# Patient Record
Sex: Female | Born: 1970 | Hispanic: No | Marital: Married | Smoking: Never smoker
Health system: Southern US, Community
[De-identification: ages and names within clinical notes are randomized; demographics above are authoritative.]

## PROBLEM LIST (undated history)

## (undated) DIAGNOSIS — Q283 Other malformations of cerebral vessels: Secondary | ICD-10-CM

## (undated) HISTORY — PX: SHOULDER SURGERY: SHX246

## (undated) HISTORY — PX: BRAIN SURGERY: SHX531

## (undated) HISTORY — PX: EYE MUSCLE SURGERY: SHX370

## (undated) HISTORY — PX: THORACIC OUTLET SURGERY: SHX2502

## (undated) HISTORY — PX: COLONOSCOPY: SHX174

---

## 2004-06-07 ENCOUNTER — Encounter: Admission: RE | Admit: 2004-06-07 | Discharge: 2004-09-05 | Payer: Self-pay

## 2009-10-27 ENCOUNTER — Ambulatory Visit: Payer: Self-pay | Admitting: Diagnostic Radiology

## 2009-10-27 ENCOUNTER — Emergency Department (HOSPITAL_BASED_OUTPATIENT_CLINIC_OR_DEPARTMENT_OTHER): Admission: EM | Admit: 2009-10-27 | Discharge: 2009-10-27 | Payer: Self-pay | Admitting: Emergency Medicine

## 2011-08-23 DIAGNOSIS — I619 Nontraumatic intracerebral hemorrhage, unspecified: Secondary | ICD-10-CM | POA: Insufficient documentation

## 2012-12-07 DIAGNOSIS — F4329 Adjustment disorder with other symptoms: Secondary | ICD-10-CM | POA: Insufficient documentation

## 2012-12-07 DIAGNOSIS — R110 Nausea: Secondary | ICD-10-CM | POA: Insufficient documentation

## 2012-12-07 DIAGNOSIS — N390 Urinary tract infection, site not specified: Secondary | ICD-10-CM | POA: Insufficient documentation

## 2013-05-23 DIAGNOSIS — T8859XA Other complications of anesthesia, initial encounter: Secondary | ICD-10-CM | POA: Insufficient documentation

## 2013-05-23 DIAGNOSIS — Z862 Personal history of diseases of the blood and blood-forming organs and certain disorders involving the immune mechanism: Secondary | ICD-10-CM | POA: Insufficient documentation

## 2013-09-26 DIAGNOSIS — G8110 Spastic hemiplegia affecting unspecified side: Secondary | ICD-10-CM | POA: Insufficient documentation

## 2013-09-26 DIAGNOSIS — R519 Headache, unspecified: Secondary | ICD-10-CM | POA: Insufficient documentation

## 2013-09-26 DIAGNOSIS — R569 Unspecified convulsions: Secondary | ICD-10-CM | POA: Insufficient documentation

## 2013-09-26 DIAGNOSIS — G43919 Migraine, unspecified, intractable, without status migrainosus: Secondary | ICD-10-CM | POA: Insufficient documentation

## 2013-09-26 DIAGNOSIS — F411 Generalized anxiety disorder: Secondary | ICD-10-CM | POA: Insufficient documentation

## 2014-10-02 DIAGNOSIS — H52201 Unspecified astigmatism, right eye: Secondary | ICD-10-CM | POA: Insufficient documentation

## 2014-10-02 DIAGNOSIS — H4921 Sixth [abducent] nerve palsy, right eye: Secondary | ICD-10-CM | POA: Insufficient documentation

## 2014-11-03 DIAGNOSIS — H5021 Vertical strabismus, right eye: Secondary | ICD-10-CM | POA: Insufficient documentation

## 2015-07-04 DIAGNOSIS — R569 Unspecified convulsions: Secondary | ICD-10-CM

## 2015-07-04 HISTORY — DX: Unspecified convulsions: R56.9

## 2015-07-22 DIAGNOSIS — G8190 Hemiplegia, unspecified affecting unspecified side: Secondary | ICD-10-CM | POA: Insufficient documentation

## 2015-07-22 DIAGNOSIS — N921 Excessive and frequent menstruation with irregular cycle: Secondary | ICD-10-CM | POA: Insufficient documentation

## 2015-07-22 DIAGNOSIS — J309 Allergic rhinitis, unspecified: Secondary | ICD-10-CM | POA: Insufficient documentation

## 2015-07-22 DIAGNOSIS — R928 Other abnormal and inconclusive findings on diagnostic imaging of breast: Secondary | ICD-10-CM | POA: Insufficient documentation

## 2015-07-22 DIAGNOSIS — N838 Other noninflammatory disorders of ovary, fallopian tube and broad ligament: Secondary | ICD-10-CM | POA: Insufficient documentation

## 2017-04-25 DIAGNOSIS — E611 Iron deficiency: Secondary | ICD-10-CM | POA: Insufficient documentation

## 2017-05-10 ENCOUNTER — Other Ambulatory Visit: Payer: Self-pay | Admitting: *Deleted

## 2017-05-10 DIAGNOSIS — N6310 Unspecified lump in the right breast, unspecified quadrant: Secondary | ICD-10-CM

## 2017-05-11 ENCOUNTER — Ambulatory Visit
Admission: RE | Admit: 2017-05-11 | Discharge: 2017-05-11 | Disposition: A | Discharge status: Home / Self Care | Payer: 59 | Source: Ambulatory Visit | Attending: *Deleted | Admitting: *Deleted

## 2017-05-11 DIAGNOSIS — N6310 Unspecified lump in the right breast, unspecified quadrant: Secondary | ICD-10-CM

## 2018-03-08 DIAGNOSIS — I872 Venous insufficiency (chronic) (peripheral): Secondary | ICD-10-CM | POA: Insufficient documentation

## 2018-05-22 DIAGNOSIS — Z9889 Other specified postprocedural states: Secondary | ICD-10-CM | POA: Insufficient documentation

## 2018-06-26 IMAGING — MG MM CLIP PLACEMENT
6 series · 6 of 14 positions shown · non-contrast
Comparison: Previous exam(s).

CLINICAL DATA: Status post stereotactic core biopsy right breast
asymmetry

EXAM:
DIAGNOSTIC RIGHT MAMMOGRAM POST STEREOTACTIC BIOPSY

[R CC synth-2D]
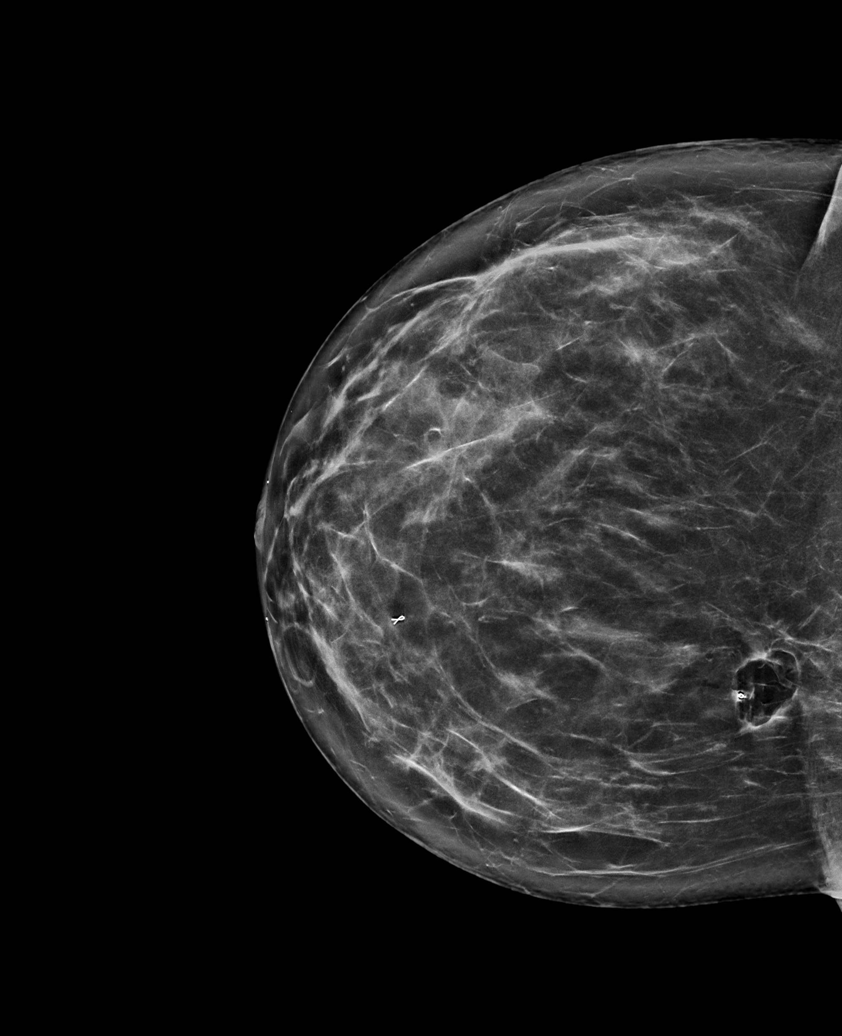

[R MLO]
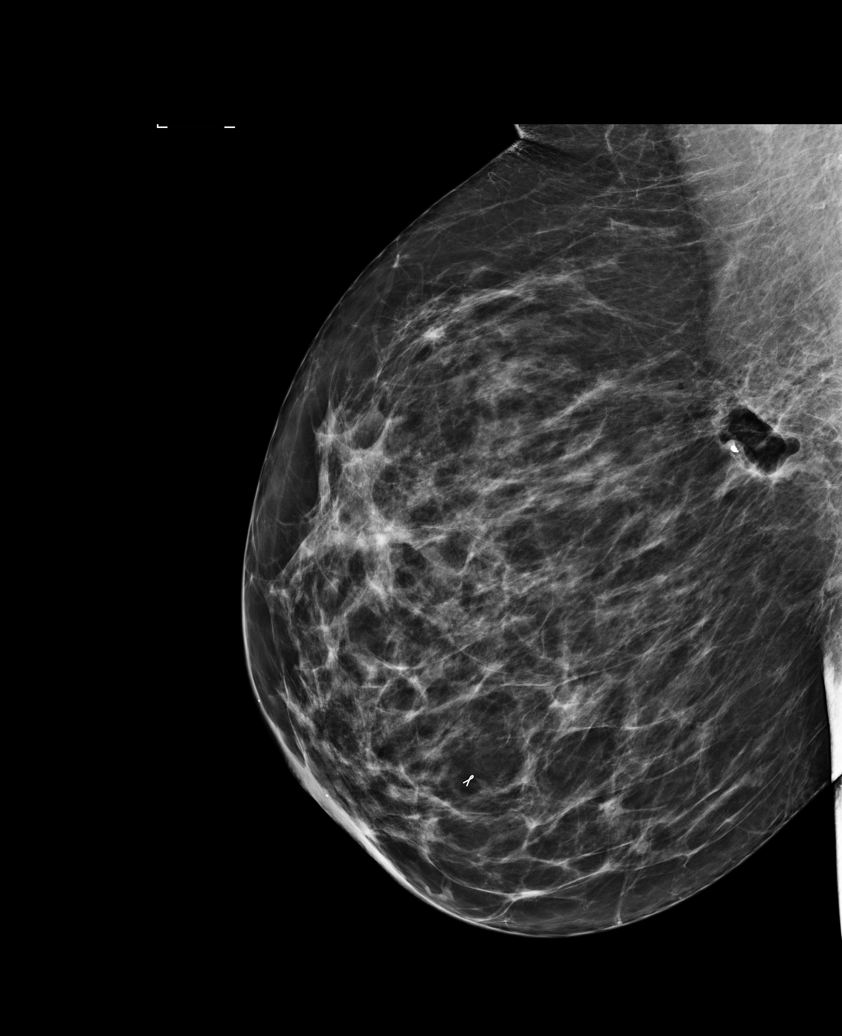

[R CC]
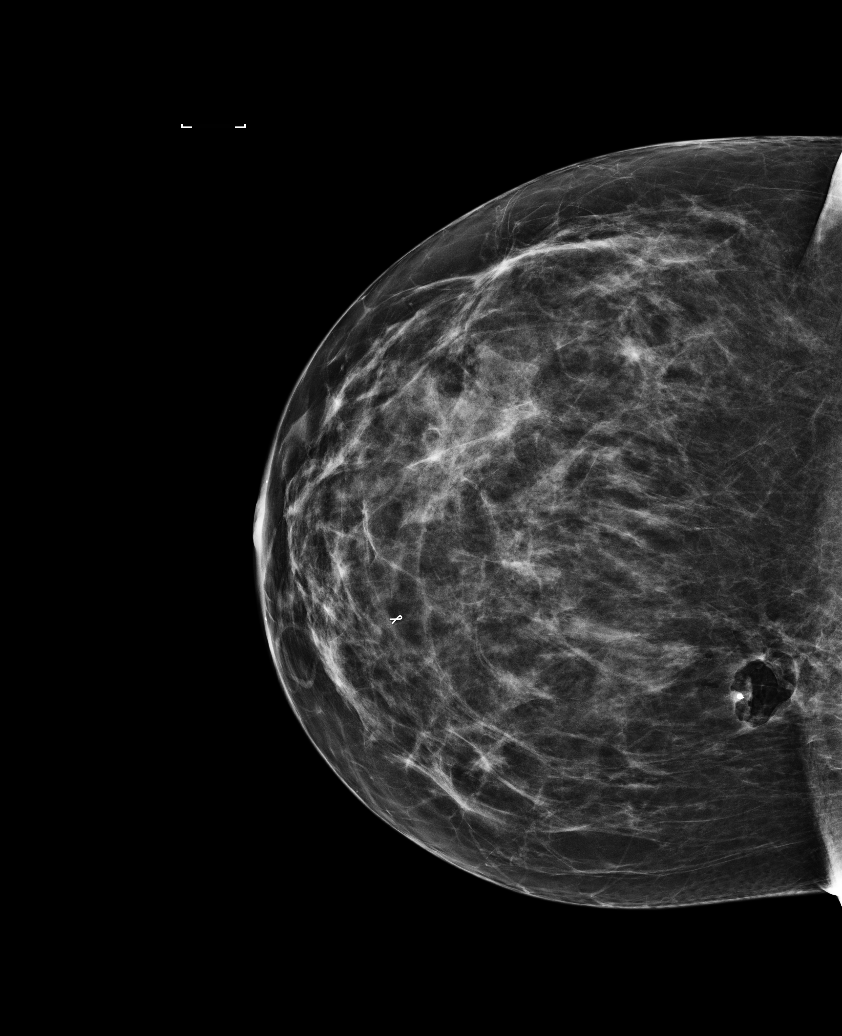

[R MLO synth-2D]
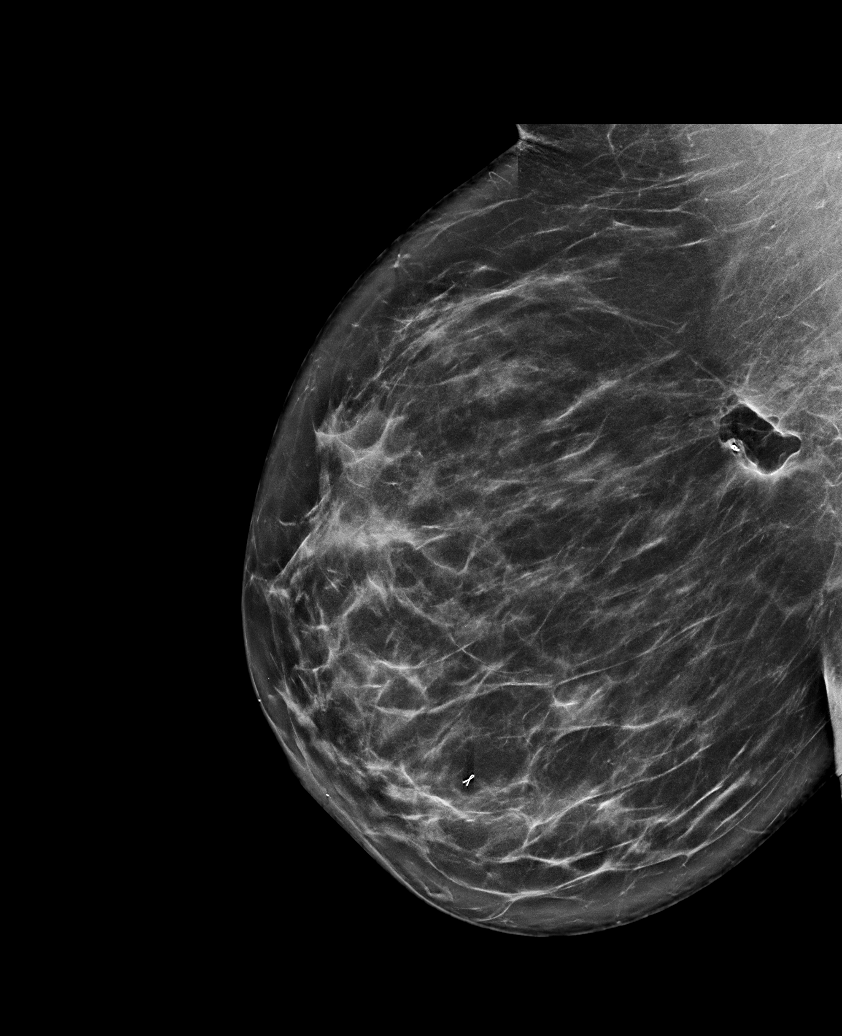

[R MLO tomo · tomo slice 43/86.0]
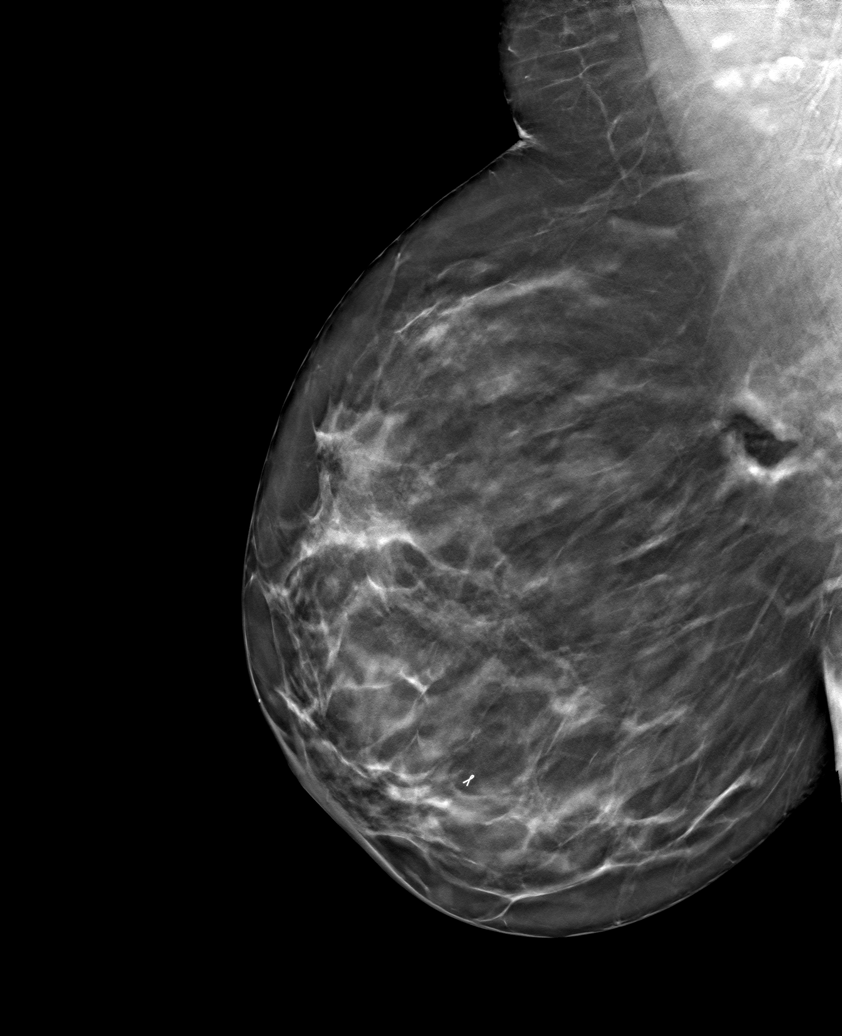

[R CC tomo · tomo slice 40/79.0]
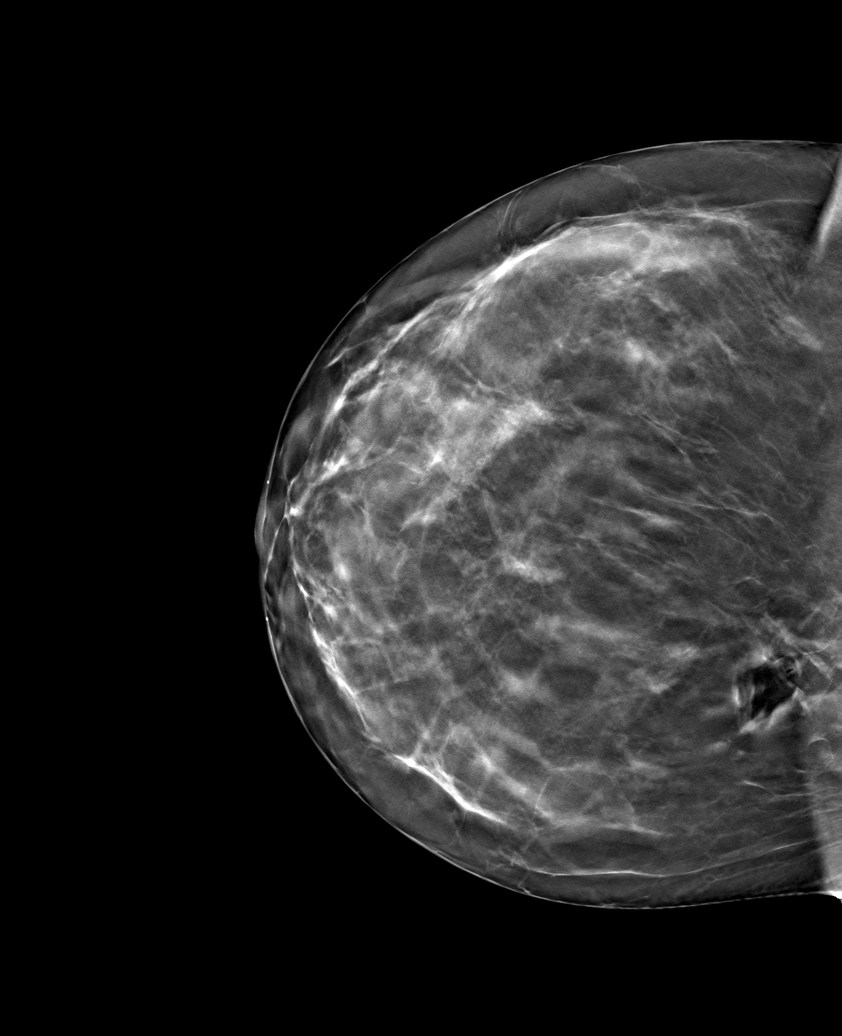

[6 of 14 positions shown; findings below may reference images not displayed]

FINDINGS: Mammographic images were obtained following right breast 3D
tomographic stereotactic guided biopsy of asymmetry in the right
breast. Cc and MLO views of the right breast demonstrate core biopsy
clip in the area of concern.
IMPRESSION: Post biopsy mammogram demonstrating biopsy clip in the area of
concern.

Final Assessment: Post Procedure Mammograms for Marker Placement

## 2019-08-27 DIAGNOSIS — M87039 Idiopathic aseptic necrosis of unspecified carpus: Secondary | ICD-10-CM | POA: Insufficient documentation

## 2019-10-06 DIAGNOSIS — R590 Localized enlarged lymph nodes: Secondary | ICD-10-CM | POA: Insufficient documentation

## 2020-02-05 DIAGNOSIS — M7918 Myalgia, other site: Secondary | ICD-10-CM | POA: Insufficient documentation

## 2020-02-05 DIAGNOSIS — M898X1 Other specified disorders of bone, shoulder: Secondary | ICD-10-CM | POA: Insufficient documentation

## 2020-02-10 DIAGNOSIS — M8560 Other cyst of bone, unspecified site: Secondary | ICD-10-CM | POA: Insufficient documentation

## 2020-02-10 DIAGNOSIS — M25531 Pain in right wrist: Secondary | ICD-10-CM | POA: Insufficient documentation

## 2020-03-23 DIAGNOSIS — Z1371 Encounter for nonprocreative screening for genetic disease carrier status: Secondary | ICD-10-CM | POA: Insufficient documentation

## 2020-05-25 DIAGNOSIS — Z6830 Body mass index (BMI) 30.0-30.9, adult: Secondary | ICD-10-CM | POA: Insufficient documentation

## 2020-05-25 DIAGNOSIS — F430 Acute stress reaction: Secondary | ICD-10-CM | POA: Insufficient documentation

## 2020-06-16 DIAGNOSIS — N951 Menopausal and female climacteric states: Secondary | ICD-10-CM | POA: Insufficient documentation

## 2020-11-16 DIAGNOSIS — G43709 Chronic migraine without aura, not intractable, without status migrainosus: Secondary | ICD-10-CM | POA: Insufficient documentation

## 2021-04-14 DIAGNOSIS — M92212 Osteochondrosis (juvenile) of carpal lunate [Kienbock], left hand: Secondary | ICD-10-CM | POA: Insufficient documentation

## 2021-07-13 DIAGNOSIS — N8003 Adenomyosis of the uterus: Secondary | ICD-10-CM | POA: Insufficient documentation

## 2021-12-08 DIAGNOSIS — M5412 Radiculopathy, cervical region: Secondary | ICD-10-CM | POA: Insufficient documentation

## 2022-04-10 DIAGNOSIS — G540 Brachial plexus disorders: Secondary | ICD-10-CM | POA: Insufficient documentation

## 2022-05-09 ENCOUNTER — Encounter (HOSPITAL_BASED_OUTPATIENT_CLINIC_OR_DEPARTMENT_OTHER): Payer: Self-pay | Admitting: Emergency Medicine

## 2022-05-09 ENCOUNTER — Emergency Department (HOSPITAL_BASED_OUTPATIENT_CLINIC_OR_DEPARTMENT_OTHER): Payer: Medicare Other

## 2022-05-09 ENCOUNTER — Emergency Department (HOSPITAL_BASED_OUTPATIENT_CLINIC_OR_DEPARTMENT_OTHER)
Admission: EM | Admit: 2022-05-09 | Discharge: 2022-05-10 | Disposition: A | Discharge status: Home / Self Care | Payer: Medicare Other | Attending: Emergency Medicine | Admitting: Emergency Medicine

## 2022-05-09 DIAGNOSIS — D72829 Elevated white blood cell count, unspecified: Secondary | ICD-10-CM | POA: Insufficient documentation

## 2022-05-09 DIAGNOSIS — R1030 Lower abdominal pain, unspecified: Secondary | ICD-10-CM | POA: Diagnosis present

## 2022-05-09 DIAGNOSIS — K5903 Drug induced constipation: Secondary | ICD-10-CM | POA: Diagnosis not present

## 2022-05-09 LAB — CBC WITH DIFFERENTIAL/PLATELET
Abs Immature Granulocytes: 0.02 10*3/uL (ref 0.00–0.07)
Basophils Absolute: 0 10*3/uL (ref 0.0–0.1)
Basophils Relative: 0 %
Eosinophils Absolute: 0 10*3/uL (ref 0.0–0.5)
Eosinophils Relative: 0 %
HCT: 45.2 % (ref 36.0–46.0)
Hemoglobin: 14.9 g/dL (ref 12.0–15.0)
Immature Granulocytes: 0 %
Lymphocytes Relative: 7 %
Lymphs Abs: 0.9 10*3/uL (ref 0.7–4.0)
MCH: 28.7 pg (ref 26.0–34.0)
MCHC: 33 g/dL (ref 30.0–36.0)
MCV: 86.9 fL (ref 80.0–100.0)
Monocytes Absolute: 0.4 10*3/uL (ref 0.1–1.0)
Monocytes Relative: 4 %
Neutro Abs: 10.5 10*3/uL — ABNORMAL HIGH (ref 1.7–7.7)
Neutrophils Relative %: 89 %
Platelets: 287 10*3/uL (ref 150–400)
RBC: 5.2 MIL/uL — ABNORMAL HIGH (ref 3.87–5.11)
RDW: 13.8 % (ref 11.5–15.5)
WBC: 11.9 10*3/uL — ABNORMAL HIGH (ref 4.0–10.5)
nRBC: 0 % (ref 0.0–0.2)

## 2022-05-09 LAB — COMPREHENSIVE METABOLIC PANEL
ALT: 96 U/L — ABNORMAL HIGH (ref 0–44)
AST: 49 U/L — ABNORMAL HIGH (ref 15–41)
Albumin: 4 g/dL (ref 3.5–5.0)
Alkaline Phosphatase: 65 U/L (ref 38–126)
Anion gap: 8 (ref 5–15)
BUN: 12 mg/dL (ref 6–20)
CO2: 27 mmol/L (ref 22–32)
Calcium: 9.1 mg/dL (ref 8.9–10.3)
Chloride: 103 mmol/L (ref 98–111)
Creatinine, Ser: 0.81 mg/dL (ref 0.44–1.00)
GFR, Estimated: >60 mL/min (ref >60)
Glucose, Bld: 123 mg/dL — ABNORMAL HIGH (ref 70–99)
Potassium: 4.2 mmol/L (ref 3.5–5.1)
Sodium: 138 mmol/L (ref 135–145)
Total Bilirubin: 0.2 mg/dL — ABNORMAL LOW (ref 0.3–1.2)
Total Protein: 7.7 g/dL (ref 6.5–8.1)

## 2022-05-09 LAB — URINALYSIS, ROUTINE W REFLEX MICROSCOPIC
Bilirubin Urine: NEGATIVE
Glucose, UA: NEGATIVE mg/dL
Hgb urine dipstick: NEGATIVE
Ketones, ur: NEGATIVE mg/dL
Leukocytes,Ua: NEGATIVE
Nitrite: NEGATIVE
Protein, ur: 30 mg/dL — AB
Specific Gravity, Urine: 1.02 (ref 1.005–1.030)
pH: 6.5 (ref 5.0–8.0)

## 2022-05-09 LAB — URINALYSIS, MICROSCOPIC (REFLEX)

## 2022-05-09 LAB — LIPASE, BLOOD: Lipase: 30 U/L (ref 11–51)

## 2022-05-09 MED ORDER — IOHEXOL 300 MG/ML  SOLN
100.0000 mL | Freq: Once | INTRAMUSCULAR | Status: AC | PRN
  Administered 2022-05-09: 100 mL via INTRAVENOUS

## 2022-05-09 MED ORDER — OXYCODONE HCL 5 MG PO TABS
5.0000 mg | ORAL_TABLET | Freq: Once | ORAL | Status: AC
  Administered 2022-05-09: 5 mg via ORAL
  Filled 2022-05-09: qty 1

## 2022-05-09 MED ORDER — POLYETHYLENE GLYCOL 3350 17 G PO PACK
17.0000 g | PACK | Freq: Every day | ORAL | Status: DC
  Administered 2022-05-09: 17 g via ORAL
  Filled 2022-05-09: qty 1

## 2022-05-09 NOTE — Discharge Instructions (Addendum)
  It was a pleasure taking care of you today!   Your abdominal x-ray and CT scan was notable for moderate amount of stool.  It is important to maintain hydration and ensure to maintain fluid intake.  Ensure to eat fiber rich foods.  Take 8 scoops of miralax in 32oz of whatever you would like to drink.(Gatorade comes in this size) You can also use a fleets enema which you can buy over the counter at the pharmacy.  You may follow-up with your primary care provider as needed.  Return to the emergency department if you are experiencing increasing/worsening constipation, abdominal pain, vomiting, unable to maintain fluid intake, fever, or worsening symptoms.

## 2022-05-09 NOTE — ED Provider Notes (Signed)
[facility] EMERGENCY DEPARTMENT Provider Note   CSN: [id number] Arrival date & time: 05/09/22  1552     History  Chief Complaint  Patient presents with   Constipation    Krystal Schultz is a 51 y.o. female who presents emergency department with concerns for constipation onset 11/1.  Patient notes that she has shoulder surgery on 11/2.  Has been taking 5 mg oxycodone since the surgery.  Has not been taking any stool softeners or increasing her water and fiber intake until today when she took a stool softener, fleet enema, and digital manipulation.  Notes that she has increased pain to the area.  Denies past medical history of hemorrhoids.  Has associated lower abdominal pain.  Denies nausea, vomiting.  The history is provided by the patient and the spouse. No language interpreter was used.       Home Medications Prior to Admission medications   Not on File      Allergies    Flurbiprofen and Aspirin    Review of Systems   Review of Systems  Gastrointestinal:  Positive for constipation.  All other systems reviewed and are negative.   Physical Exam Updated Vital Signs BP 119/75 (BP Location: Right Arm)   Pulse 87   Temp 98.5 F (36.9 C) (Oral)   Resp 16   Ht 5\' 7"  (1.702 m)   Wt 79.4 kg   SpO2 100%   BMI 27.41 kg/m  Physical Exam Vitals and nursing note reviewed.  Constitutional:      General: She is not in acute distress.    Appearance: She is not diaphoretic.  HENT:     Head: Normocephalic and atraumatic.     Mouth/Throat:     Pharynx: No oropharyngeal exudate.  Eyes:     General: No scleral icterus.    Conjunctiva/sclera: Conjunctivae normal.  Cardiovascular:     Rate and Rhythm: Normal rate and regular rhythm.     Pulses: Normal pulses.     Heart sounds: Normal heart sounds.  Pulmonary:     Effort: Pulmonary effort is normal. No respiratory distress.     Breath sounds: Normal breath sounds. No wheezing.  Abdominal:     General: Bowel sounds  are normal.     Palpations: Abdomen is soft. There is no mass.     Tenderness: There is generalized abdominal tenderness. There is no guarding or rebound.     Comments: Diffuse abdominal tenderness to palpation  Genitourinary:    Comments: Paramedic chaperons present for exam.  No external hemorrhoids noted. Musculoskeletal:        General: Normal range of motion.     Cervical back: Normal range of motion and neck supple.  Skin:    General: Skin is warm and dry.  Neurological:     Mental Status: She is alert.  Psychiatric:        Behavior: Behavior normal.     ED Results / Procedures / Treatments   Labs (all labs ordered are listed, but only abnormal results are displayed) Labs Reviewed  CBC WITH DIFFERENTIAL/PLATELET - Abnormal; Notable for the following components:      Result Value   WBC 11.9 (*)    RBC 5.20 (*)    Neutro Abs 10.5 (*)    All other components within normal limits  COMPREHENSIVE METABOLIC PANEL - Abnormal; Notable for the following components:   Glucose, Bld 123 (*)    AST 49 (*)    ALT 96 (*)  Total Bilirubin 0.2 (*)    All other components within normal limits  URINALYSIS, ROUTINE W REFLEX MICROSCOPIC - Abnormal; Notable for the following components:   Protein, ur 30 (*)    All other components within normal limits  URINALYSIS, MICROSCOPIC (REFLEX) - Abnormal; Notable for the following components:   Bacteria, UA FEW (*)    All other components within normal limits  LIPASE, BLOOD    EKG None  Radiology CT ABDOMEN PELVIS W CONTRAST  Result Date: 05/09/2022 CLINICAL DATA:  Suspected bowel obstruction. EXAM: CT ABDOMEN AND PELVIS WITH CONTRAST TECHNIQUE: Multidetector CT imaging of the abdomen and pelvis was performed using the standard protocol following bolus administration of intravenous contrast. RADIATION DOSE REDUCTION: This exam was performed according to the departmental dose-optimization program which includes automated exposure control,  adjustment of the mA and/or kV according to patient size and/or use of iterative reconstruction technique. CONTRAST:  OMNIPAQUE IOHEXOL 300 MG/ML  SOLN COMPARISON:  April 13, 2018 FINDINGS: Lower chest: Very small bilateral pleural effusions are seen. Hepatobiliary: A stable, 17 mm x 18 mm ill-defined area of parenchymal low attenuation is seen within the posterior aspect of the right lobe of the liver (axial CT image 11, CT series 2). No gallstones, gallbladder wall thickening, or biliary dilatation. Pancreas: Unremarkable. No pancreatic ductal dilatation or surrounding inflammatory changes. Spleen: Normal in size without focal abnormality. Adrenals/Urinary Tract: Adrenal glands are unremarkable. Kidneys are normal, without renal calculi, focal lesion, or hydronephrosis. Urinary bladder is poorly distended and subsequently limited in evaluation. Stomach/Bowel: There is a small hiatal hernia. Appendix appears normal. There is a large stool burden with a very large amount of impacted stool seen within the mid to distal sigmoid colon and rectum. No evidence of bowel wall thickening, distention, or inflammatory changes. Vascular/Lymphatic: No significant vascular findings are present. No enlarged abdominal or pelvic lymph nodes. Reproductive: Uterus and bilateral adnexa are unremarkable. Other: No abdominal wall hernia or abnormality. No abdominopelvic ascites. Musculoskeletal: No acute or significant osseous findings. IMPRESSION: 1. Large stool burden with a very large amount of impacted stool within the mid to distal sigmoid colon and rectum. 2. Very small bilateral pleural effusions. 3. Findings likely consistent with a stable hepatic hemangioma. Correlation with nonemergent hepatic ultrasound is recommended. 4. Small hiatal hernia. Electronically Signed   By: Aram Candela M.D.   On: 05/09/2022 19:29   DG Abdomen 1 View  Result Date: 05/09/2022 CLINICAL DATA:  Possible constipation. EXAM: ABDOMEN -  1 VIEW COMPARISON:  None Available. FINDINGS: Single frontal view of the abdomen obtained. The upper abdomen is not included in the field of view. Moderate stool in the ascending colon. Moderate stool in the rectosigmoid colon. Rectal distention of 8 cm. No small bowel dilatation or evidence of obstruction. Calcifications in the pelvis are typical of phleboliths. No abdominal radiopaque calculi. No acute osseous abnormalities are seen. IMPRESSION: Moderate stool in the ascending and rectosigmoid colon with rectal distention of 8 cm. No bowel obstruction. Electronically Signed   By: Narda Rutherford M.D.   On: 05/09/2022 16:35    Procedures Fecal disimpaction  Date/Time: 05/10/2022 12:21 AM  Performed by: Chestine Spore A, PA-C Authorized by: Karenann Cai, PA-C  Consent: Verbal consent obtained. Risks and benefits: risks, benefits and alternatives were discussed Consent given by: patient Imaging studies: imaging studies available Patient identity confirmed: verbally with patient and hospital-assigned identification number Local anesthesia used: no  Anesthesia: Local anesthesia used: no  Sedation: Patient sedated: no  Patient tolerance: patient tolerated the procedure well with no immediate complications       Medications Ordered in ED Medications  polyethylene glycol (MIRALAX / GLYCOLAX) packet 17 g (17 g Oral Given 05/09/22 2212)  iohexol (OMNIPAQUE) 300 MG/ML solution 100 mL (100 mLs Intravenous Contrast Given 05/09/22 1859)  oxyCODONE (Oxy IR/ROXICODONE) immediate release tablet 5 mg (5 mg Oral Given 05/09/22 1940)    ED Course/ Medical Decision Making/ A&P                            Medical Decision Making Amount and/or Complexity of Data Reviewed Radiology: ordered.  Risk Prescription drug management.   Pt presents with constipation onset 11/1.  Notes that she had shoulder surgery on 11/2 and was started on oxycodone.  Patient was not instructed to take MiraLAX or  increase her fiber intake while taking the narcotic. Vital signs, patient afebrile. On exam, pt with diffuse abdominal tenderness to palpation.  No acute cardiovascular, respiratory, abdominal exam findings. Differential diagnosis includes constipation, appendicitis, acute cystitis, cholecystitis, diverticulitis.   Additional history obtained:  Additional history obtained from Spouse/Significant Other  Labs:  I ordered, and personally interpreted labs.  The pertinent results include:   Lipase unremarkable. CMP with slightly elevated AST, ALT otherwise unremarkable. CBC with elevated WBC at 11.9 Urinalysis unremarkable  Imaging: I ordered imaging studies including abdominal x-ray, CT abdomen pelvis I independently visualized and interpreted imaging which showed:  Moderate stool in the ascending and rectosigmoid colon with rectal  distention of 8 cm. No bowel obstruction.   1. Large stool burden with a very large amount of impacted stool  within the mid to distal sigmoid colon and rectum.  2. Very small bilateral pleural effusions.  3. Findings likely consistent with a stable hepatic hemangioma.  Correlation with nonemergent hepatic ultrasound is recommended.  4. Small hiatal hernia.   I agree with the radiologist interpretation  Medications:  I ordered medication including MiraLAX, oxycodone for symptom management Reevaluation of the patient after these medicines and interventions, I reevaluated the patient and found that they have improved I have reviewed the patients home medicines and have made adjustments as needed   Disposition: Presentation suspicious for constipation in the setting of recent narcotic use.  Doubt bowel obstruction, appendicitis, cholecystitis, pancreatitis, diverticulitis. After consideration of the diagnostic results and the patients response to treatment, I feel that the patient would benefit from Discharge home.  Patient instructed on use of MiraLAX.   Provided work note.  Supportive care measures and strict return precautions discussed with patient at bedside. Pt acknowledges and verbalizes understanding. Pt appears safe for discharge. Follow up as indicated in discharge paperwork.    This chart was dictated using voice recognition software, Dragon. Despite the best efforts of this provider to proofread and correct errors, errors may still occur which can change documentation meaning.  Final Clinical Impression(s) / ED Diagnoses Final diagnoses:  Drug-induced constipation    Rx / DC Orders ED Discharge Orders     None         Sulamita Lafountain A, PA-C 05/10/22 0028    Deno Etienne, DO 05/10/22 3614

## 2022-05-09 NOTE — ED Triage Notes (Signed)
Reports possible constipation since Wednesday (11/1). Has shoulder surgery on 11/2 and is concerned the pain medication she is taking post-op is causing the constipation. Unrelieved with stool softeners, enema, and digital manipulation. Pressure in anus area. Unable to sit.

## 2022-05-09 NOTE — ED Notes (Signed)
Pt companion requested 2 kind bars and a cola for his self. Companion given the same

## 2022-07-14 DIAGNOSIS — M7542 Impingement syndrome of left shoulder: Secondary | ICD-10-CM | POA: Insufficient documentation

## 2022-07-14 DIAGNOSIS — M654 Radial styloid tenosynovitis [de Quervain]: Secondary | ICD-10-CM | POA: Insufficient documentation

## 2022-09-14 NOTE — H&P (Signed)
ubjective:    HPI   Returns for follow up discussion prior to planned breast reduction. Motivated in part by daughter's experience who had reduction surgery with me last year. Current 40DD. Reports several year history neck back and shoulder pain, especially upper back between shoulder blades. 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 Her attempts at pain control over years has included specialty fitted bras, Tylenol, PT, gabapentin for over 3 month trial each without change. Denies rashes.    Avoids ASA/NSAIDs due to cerebral hemorrhage history.   Wt down over 50 lb over last year since starting Ozempic for weight loss. Goal 160-165 lb.   MMG 05/2022 normal. Half sister with history premenopausal breast ca. Patient reports she herself tested for BRCA mutations and this was negative. Father with colon ca.   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 Underwent surgery for removal. 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   Disabled secondary to above. Lives with spouse.   Review of Systems  Constitutional: Positive for fatigue.  HENT: Positive for sinus pressure.   Eyes: Positive for visual disturbance.  Musculoskeletal: Positive for back pain and neck pain.  Neurological: Positive for weakness, numbness and headaches.  Psychiatric/Behavioral: Positive for sleep disturbance.  Remainder 12 point review negative     Objective:  Physical Exam Cardiovascular:     Normal rate. Normal heart sounds Pulmonary: Clear to auscultation Lymphadenopathy:     Upper Body:     Right upper body: No axillary adenopathy.     Left upper body: No axillary adenopathy.  Skin:    Comments: Fitzpatrick 3      +shoulder grooving Breasts: no masses grade 3 ptosis bilateral SN to nipple R 35 L 33 cm BW R  21 L 20 cm Nipple to IMF R 11 L 11 cm     Assessment:    Macromastia Chronic neck back shoulder pain     Plan:    Hold Ozempic week prior to surgery.   Chronic neck and back and shoulder pain > 6 month duration that has failed conservative measures including supportive garments, PT, analgesia. Macromastia is not due to an active endocrine or metabolic process.   Reviewed reduction with anchor type scars, OP surgery, drains, post operative visits and limitations, recovery. Diminished sensation nipple and breast skin, risk of nipple loss, wound healing problems, asymmetry, incidental carcinoma, changes with wt gain/loss, aging, unacceptable cosmetic appearance reviewed.    Additional risks including but not limited to bleeding hematoma seroma need for additional procedures infection damage to adjacent structures blood clots in legs or lungs reviewed. Completed ASPS consent for breast reduction.   Anticipate at least 500 g reduction from each breast.   Drain teaching completed. Rx for oxycodone given.   Ok to proceed with scheduled injections with pain service. Hold PT/OT  post op at least through follow up visit.

## 2022-09-22 ENCOUNTER — Encounter (HOSPITAL_BASED_OUTPATIENT_CLINIC_OR_DEPARTMENT_OTHER): Payer: Self-pay | Admitting: Plastic Surgery

## 2022-09-22 ENCOUNTER — Other Ambulatory Visit: Payer: Self-pay

## 2022-10-02 NOTE — Anesthesia Preprocedure Evaluation (Signed)
Anesthesia Evaluation  Patient identified by MRN, date of birth, ID band Patient awake    Reviewed: Allergy & Precautions, H&P , NPO status , Patient's Chart, lab work & pertinent test results  Airway Mallampati: II  TM Distance: >3 FB Neck ROM: Full    Dental no notable dental hx. (+) Dental Advisory Given, Teeth Intact   Pulmonary neg pulmonary ROS   Pulmonary exam normal breath sounds clear to auscultation       Cardiovascular Exercise Tolerance: Good negative cardio ROS  Rhythm:Regular Rate:Normal     Neuro/Psych Seizures -, Well Controlled,   negative psych ROS   GI/Hepatic negative GI ROS, Neg liver ROS,,,  Endo/Other  negative endocrine ROS    Renal/GU negative Renal ROS  negative genitourinary   Musculoskeletal   Abdominal   Peds  Hematology negative hematology ROS (+)   Anesthesia Other Findings   Reproductive/Obstetrics negative OB ROS                             Anesthesia Physical Anesthesia Plan  ASA: 2  Anesthesia Plan: General   Post-op Pain Management: Tylenol PO (pre-op)*   Induction: Intravenous  PONV Risk Score and Plan: 4 or greater and Ondansetron, Dexamethasone, Midazolam and Scopolamine patch - Pre-op  Airway Management Planned: Oral ETT  Additional Equipment:   Intra-op Plan:   Post-operative Plan: Extubation in OR  Informed Consent: I have reviewed the patients History and Physical, chart, labs and discussed the procedure including the risks, benefits and alternatives for the proposed anesthesia with the patient or authorized representative who has indicated his/her understanding and acceptance.     Dental advisory given  Plan Discussed with: CRNA  Anesthesia Plan Comments:        Anesthesia Quick Evaluation

## 2022-10-03 ENCOUNTER — Encounter (HOSPITAL_BASED_OUTPATIENT_CLINIC_OR_DEPARTMENT_OTHER): Payer: Self-pay | Admitting: Plastic Surgery

## 2022-10-03 ENCOUNTER — Ambulatory Visit (HOSPITAL_BASED_OUTPATIENT_CLINIC_OR_DEPARTMENT_OTHER): Payer: Medicare Other | Admitting: Anesthesiology

## 2022-10-03 ENCOUNTER — Other Ambulatory Visit: Payer: Self-pay

## 2022-10-03 ENCOUNTER — Ambulatory Visit (HOSPITAL_BASED_OUTPATIENT_CLINIC_OR_DEPARTMENT_OTHER)
Admission: RE | Admit: 2022-10-03 | Discharge: 2022-10-03 | Disposition: A | Discharge status: Home / Self Care | Payer: Medicare Other | Attending: Plastic Surgery | Admitting: Plastic Surgery

## 2022-10-03 ENCOUNTER — Encounter (HOSPITAL_BASED_OUTPATIENT_CLINIC_OR_DEPARTMENT_OTHER)
Admission: RE | Disposition: A | Discharge status: Home / Self Care | Payer: Self-pay | Source: Home / Self Care | Attending: Plastic Surgery

## 2022-10-03 DIAGNOSIS — M25519 Pain in unspecified shoulder: Secondary | ICD-10-CM | POA: Diagnosis not present

## 2022-10-03 DIAGNOSIS — I69154 Hemiplegia and hemiparesis following nontraumatic intracerebral hemorrhage affecting left non-dominant side: Secondary | ICD-10-CM | POA: Diagnosis not present

## 2022-10-03 DIAGNOSIS — Z803 Family history of malignant neoplasm of breast: Secondary | ICD-10-CM | POA: Insufficient documentation

## 2022-10-03 DIAGNOSIS — M542 Cervicalgia: Secondary | ICD-10-CM | POA: Diagnosis not present

## 2022-10-03 DIAGNOSIS — Z7985 Long-term (current) use of injectable non-insulin antidiabetic drugs: Secondary | ICD-10-CM | POA: Insufficient documentation

## 2022-10-03 DIAGNOSIS — N62 Hypertrophy of breast: Secondary | ICD-10-CM | POA: Diagnosis present

## 2022-10-03 DIAGNOSIS — I69198 Other sequelae of nontraumatic intracerebral hemorrhage: Secondary | ICD-10-CM | POA: Diagnosis not present

## 2022-10-03 DIAGNOSIS — G8929 Other chronic pain: Secondary | ICD-10-CM | POA: Diagnosis not present

## 2022-10-03 DIAGNOSIS — H4920 Sixth [abducent] nerve palsy, unspecified eye: Secondary | ICD-10-CM | POA: Diagnosis not present

## 2022-10-03 DIAGNOSIS — M549 Dorsalgia, unspecified: Secondary | ICD-10-CM | POA: Diagnosis not present

## 2022-10-03 HISTORY — DX: Other malformations of cerebral vessels: Q28.3

## 2022-10-03 HISTORY — PX: BREAST REDUCTION SURGERY: SHX8

## 2022-10-03 SURGERY — MAMMOPLASTY, REDUCTION
Anesthesia: General | Site: Breast | Laterality: Bilateral

## 2022-10-03 MED ORDER — LIDOCAINE 2% (20 MG/ML) 5 ML SYRINGE
INTRAMUSCULAR | Status: DC | PRN
  Administered 2022-10-03: 60 mg via INTRAVENOUS

## 2022-10-03 MED ORDER — ROCURONIUM BROMIDE 10 MG/ML (PF) SYRINGE
PREFILLED_SYRINGE | INTRAVENOUS | Status: AC
  Filled 2022-10-03: qty 10

## 2022-10-03 MED ORDER — MIDAZOLAM HCL 2 MG/2ML IJ SOLN
INTRAMUSCULAR | Status: DC | PRN
  Administered 2022-10-03: 2 mg via INTRAVENOUS

## 2022-10-03 MED ORDER — CHLORHEXIDINE GLUCONATE CLOTH 2 % EX PADS: 6.0000 | MEDICATED_PAD | Freq: Once | CUTANEOUS | Status: DC

## 2022-10-03 MED ORDER — BUPIVACAINE HCL (PF) 0.5 % IJ SOLN
INTRAMUSCULAR | Status: AC
  Filled 2022-10-03: qty 30

## 2022-10-03 MED ORDER — DEXAMETHASONE SODIUM PHOSPHATE 10 MG/ML IJ SOLN
INTRAMUSCULAR | Status: AC
  Filled 2022-10-03: qty 1

## 2022-10-03 MED ORDER — EPHEDRINE 5 MG/ML INJ
INTRAVENOUS | Status: AC
  Filled 2022-10-03: qty 5

## 2022-10-03 MED ORDER — ACETAMINOPHEN 500 MG PO TABS
1000.0000 mg | ORAL_TABLET | ORAL | Status: AC
  Administered 2022-10-03: 1000 mg via ORAL

## 2022-10-03 MED ORDER — ACETAMINOPHEN 500 MG PO TABS: 1000.0000 mg | ORAL_TABLET | Freq: Once | ORAL | Status: DC

## 2022-10-03 MED ORDER — ONDANSETRON HCL 4 MG/2ML IJ SOLN
INTRAMUSCULAR | Status: AC
  Filled 2022-10-03: qty 2

## 2022-10-03 MED ORDER — LACTATED RINGERS IV SOLN: INTRAVENOUS | Status: DC

## 2022-10-03 MED ORDER — CEFAZOLIN SODIUM-DEXTROSE 2-4 GM/100ML-% IV SOLN
INTRAVENOUS | Status: AC
  Filled 2022-10-03: qty 100

## 2022-10-03 MED ORDER — BUPIVACAINE HCL (PF) 0.5 % IJ SOLN
INTRAMUSCULAR | Status: DC | PRN
  Administered 2022-10-03: 30 mL

## 2022-10-03 MED ORDER — HYDROMORPHONE HCL 1 MG/ML IJ SOLN
INTRAMUSCULAR | Status: AC
  Filled 2022-10-03: qty 0.5

## 2022-10-03 MED ORDER — ACETAMINOPHEN 500 MG PO TABS
ORAL_TABLET | ORAL | Status: AC
  Filled 2022-10-03: qty 2

## 2022-10-03 MED ORDER — SUGAMMADEX SODIUM 200 MG/2ML IV SOLN
INTRAVENOUS | Status: DC | PRN
  Administered 2022-10-03: 200 mg via INTRAVENOUS

## 2022-10-03 MED ORDER — FENTANYL CITRATE (PF) 100 MCG/2ML IJ SOLN
INTRAMUSCULAR | Status: AC
  Filled 2022-10-03: qty 2

## 2022-10-03 MED ORDER — OXYCODONE HCL 5 MG PO TABS
ORAL_TABLET | ORAL | Status: AC
  Filled 2022-10-03: qty 1

## 2022-10-03 MED ORDER — LIDOCAINE 2% (20 MG/ML) 5 ML SYRINGE
INTRAMUSCULAR | Status: AC
  Filled 2022-10-03: qty 5

## 2022-10-03 MED ORDER — EPHEDRINE SULFATE-NACL 50-0.9 MG/10ML-% IV SOSY
PREFILLED_SYRINGE | INTRAVENOUS | Status: DC | PRN
  Administered 2022-10-03: 10 mg via INTRAVENOUS

## 2022-10-03 MED ORDER — ROCURONIUM BROMIDE 10 MG/ML (PF) SYRINGE
PREFILLED_SYRINGE | INTRAVENOUS | Status: DC | PRN
  Administered 2022-10-03: 20 mg via INTRAVENOUS
  Administered 2022-10-03: 50 mg via INTRAVENOUS

## 2022-10-03 MED ORDER — SCOPOLAMINE 1 MG/3DAYS TD PT72
MEDICATED_PATCH | TRANSDERMAL | Status: AC
  Filled 2022-10-03: qty 1

## 2022-10-03 MED ORDER — CEFAZOLIN SODIUM-DEXTROSE 2-4 GM/100ML-% IV SOLN
2.0000 g | INTRAVENOUS | Status: AC
  Administered 2022-10-03: 2 g via INTRAVENOUS

## 2022-10-03 MED ORDER — PROPOFOL 10 MG/ML IV BOLUS
INTRAVENOUS | Status: AC
  Filled 2022-10-03: qty 20

## 2022-10-03 MED ORDER — 0.9 % SODIUM CHLORIDE (POUR BTL) OPTIME
TOPICAL | Status: DC | PRN
  Administered 2022-10-03: 500 mL

## 2022-10-03 MED ORDER — SCOPOLAMINE 1 MG/3DAYS TD PT72
1.0000 | MEDICATED_PATCH | TRANSDERMAL | Status: DC
  Administered 2022-10-03: 1.5 mg via TRANSDERMAL

## 2022-10-03 MED ORDER — OXYCODONE HCL 5 MG PO TABS
5.0000 mg | ORAL_TABLET | ORAL | Status: AC | PRN
  Administered 2022-10-03: 5 mg via ORAL

## 2022-10-03 MED ORDER — ONDANSETRON HCL 4 MG/2ML IJ SOLN
INTRAMUSCULAR | Status: DC | PRN
  Administered 2022-10-03: 4 mg via INTRAVENOUS

## 2022-10-03 MED ORDER — PHENYLEPHRINE 80 MCG/ML (10ML) SYRINGE FOR IV PUSH (FOR BLOOD PRESSURE SUPPORT)
PREFILLED_SYRINGE | INTRAVENOUS | Status: AC
  Filled 2022-10-03: qty 10

## 2022-10-03 MED ORDER — PROPOFOL 10 MG/ML IV BOLUS
INTRAVENOUS | Status: DC | PRN
  Administered 2022-10-03: 130 mg via INTRAVENOUS

## 2022-10-03 MED ORDER — FENTANYL CITRATE (PF) 100 MCG/2ML IJ SOLN
INTRAMUSCULAR | Status: DC | PRN
  Administered 2022-10-03 (×2): 50 ug via INTRAVENOUS
  Administered 2022-10-03: 100 ug via INTRAVENOUS

## 2022-10-03 MED ORDER — PHENYLEPHRINE 80 MCG/ML (10ML) SYRINGE FOR IV PUSH (FOR BLOOD PRESSURE SUPPORT)
PREFILLED_SYRINGE | INTRAVENOUS | Status: DC | PRN
  Administered 2022-10-03 (×5): 160 ug via INTRAVENOUS

## 2022-10-03 MED ORDER — DEXAMETHASONE SODIUM PHOSPHATE 10 MG/ML IJ SOLN
INTRAMUSCULAR | Status: DC | PRN
  Administered 2022-10-03: 10 mg via INTRAVENOUS

## 2022-10-03 MED ORDER — MIDAZOLAM HCL 2 MG/2ML IJ SOLN
INTRAMUSCULAR | Status: AC
  Filled 2022-10-03: qty 2

## 2022-10-03 MED ORDER — HYDROMORPHONE HCL 1 MG/ML IJ SOLN
0.2500 mg | INTRAMUSCULAR | Status: DC | PRN
  Administered 2022-10-03: 0.25 mg via INTRAVENOUS

## 2022-10-03 MED ORDER — GABAPENTIN 300 MG PO CAPS
300.0000 mg | ORAL_CAPSULE | ORAL | Status: AC
  Administered 2022-10-03: 300 mg via ORAL

## 2022-10-03 MED ORDER — GABAPENTIN 300 MG PO CAPS
ORAL_CAPSULE | ORAL | Status: AC
  Filled 2022-10-03: qty 1

## 2022-10-03 SURGICAL SUPPLY — 55 items
ADH SKN CLS APL DERMABOND .7 (GAUZE/BANDAGES/DRESSINGS) ×2
APL PRP STRL LF DISP 70% ISPRP (MISCELLANEOUS) ×2
BINDER BREAST 3XL (GAUZE/BANDAGES/DRESSINGS) IMPLANT
BINDER BREAST LRG (GAUZE/BANDAGES/DRESSINGS) IMPLANT
BINDER BREAST MEDIUM (GAUZE/BANDAGES/DRESSINGS) IMPLANT
BINDER BREAST XLRG (GAUZE/BANDAGES/DRESSINGS) IMPLANT
BINDER BREAST XXLRG (GAUZE/BANDAGES/DRESSINGS) IMPLANT
BLADE SURG 10 STRL SS (BLADE) ×4 IMPLANT
BNDG GAUZE DERMACEA FLUFF 4 (GAUZE/BANDAGES/DRESSINGS) ×2 IMPLANT
BNDG GZE DERMACEA 4 6PLY (GAUZE/BANDAGES/DRESSINGS) ×2
CANISTER SUCT 1200ML W/VALVE (MISCELLANEOUS) ×1 IMPLANT
CHLORAPREP W/TINT 26 (MISCELLANEOUS) ×2 IMPLANT
COVER BACK TABLE 60X90IN (DRAPES) ×1 IMPLANT
COVER MAYO STAND STRL (DRAPES) ×1 IMPLANT
DERMABOND ADVANCED .7 DNX12 (GAUZE/BANDAGES/DRESSINGS) ×2 IMPLANT
DRAIN CHANNEL 15F RND FF W/TCR (WOUND CARE) IMPLANT
DRAIN CHANNEL 19F RND (DRAIN) IMPLANT
DRAPE TOP ARMCOVERS (MISCELLANEOUS) ×1 IMPLANT
DRAPE U-SHAPE 76X120 STRL (DRAPES) ×1 IMPLANT
DRAPE UTILITY XL STRL (DRAPES) ×1 IMPLANT
ELECT COATED BLADE 2.86 ST (ELECTRODE) ×1 IMPLANT
ELECT REM PT RETURN 9FT ADLT (ELECTROSURGICAL) ×1
ELECTRODE REM PT RTRN 9FT ADLT (ELECTROSURGICAL) ×1 IMPLANT
EVACUATOR SILICONE 100CC (DRAIN) IMPLANT
GAUZE PAD ABD 8X10 STRL (GAUZE/BANDAGES/DRESSINGS) ×2 IMPLANT
GLOVE BIO SURGEON STRL SZ 6 (GLOVE) ×2 IMPLANT
GLOVE BIO SURGEON STRL SZ8 (GLOVE) IMPLANT
GLOVE BIOGEL PI IND STRL 7.0 (GLOVE) IMPLANT
GLOVE BIOGEL PI IND STRL 8 (GLOVE) IMPLANT
GOWN STRL REUS W/ TWL LRG LVL3 (GOWN DISPOSABLE) ×2 IMPLANT
GOWN STRL REUS W/ TWL XL LVL3 (GOWN DISPOSABLE) IMPLANT
GOWN STRL REUS W/TWL LRG LVL3 (GOWN DISPOSABLE) ×1
GOWN STRL REUS W/TWL XL LVL3 (GOWN DISPOSABLE) ×1
MARKER SKIN DUAL TIP RULER LAB (MISCELLANEOUS) IMPLANT
NDL HYPO 25X1 1.5 SAFETY (NEEDLE) ×1 IMPLANT
NEEDLE HYPO 25X1 1.5 SAFETY (NEEDLE) ×1 IMPLANT
NS IRRIG 1000ML POUR BTL (IV SOLUTION) ×1 IMPLANT
PACK BASIN DAY SURGERY FS (CUSTOM PROCEDURE TRAY) ×1 IMPLANT
PENCIL SMOKE EVACUATOR (MISCELLANEOUS) ×1 IMPLANT
PIN SAFETY STERILE (MISCELLANEOUS) ×1 IMPLANT
SHEET MEDIUM DRAPE 40X70 STRL (DRAPES) ×2 IMPLANT
SLEEVE SCD COMPRESS KNEE MED (STOCKING) ×1 IMPLANT
SPONGE T-LAP 18X18 ~~LOC~~+RFID (SPONGE) ×3 IMPLANT
STAPLER VISISTAT 35W (STAPLE) ×1 IMPLANT
SUT ETHILON 2 0 FS 18 (SUTURE) IMPLANT
SUT MNCRL AB 4-0 PS2 18 (SUTURE) IMPLANT
SUT VIC AB 3-0 PS1 18 (SUTURE) ×8
SUT VIC AB 3-0 PS1 18XBRD (SUTURE) IMPLANT
SUT VICRYL 4-0 PS2 18IN ABS (SUTURE) IMPLANT
SYR BULB IRRIG 60ML STRL (SYRINGE) ×1 IMPLANT
SYR CONTROL 10ML LL (SYRINGE) ×1 IMPLANT
TOWEL GREEN STERILE FF (TOWEL DISPOSABLE) ×2 IMPLANT
TUBE CONNECTING 20X1/4 (TUBING) ×1 IMPLANT
UNDERPAD 30X36 HEAVY ABSORB (UNDERPADS AND DIAPERS) ×2 IMPLANT
YANKAUER SUCT BULB TIP NO VENT (SUCTIONS) ×1 IMPLANT

## 2022-10-03 NOTE — Anesthesia Procedure Notes (Signed)
Procedure Name: Intubation Date/Time: 10/03/2022 7:38 AM  Performed by: Genelle Bal, CRNAPre-anesthesia Checklist: Patient identified, Emergency Drugs available, Suction available and Patient being monitored Patient Re-evaluated:Patient Re-evaluated prior to induction Oxygen Delivery Method: Circle system utilized Preoxygenation: Pre-oxygenation with 100% oxygen Induction Type: IV induction Ventilation: Mask ventilation without difficulty Laryngoscope Size: Miller and 2 Grade View: Grade I Tube type: Oral Tube size: 7.0 mm Number of attempts: 1 Airway Equipment and Method: Stylet and Oral airway Placement Confirmation: ETT inserted through vocal cords under direct vision, positive ETCO2 and breath sounds checked- equal and bilateral Secured at: 21 cm Tube secured with: Tape Dental Injury: Teeth and Oropharynx as per pre-operative assessment

## 2022-10-03 NOTE — Discharge Instructions (Signed)
You may have Tylenol again after 12:45pm today, if needed.   Post Anesthesia Home Care Instructions  Activity: Get plenty of rest for the remainder of the day. A responsible individual must stay with you for 24 hours following the procedure.  For the next 24 hours, DO NOT: -Drive a car -Paediatric nurse -Drink alcoholic beverages -Take any medication unless instructed by your physician -Make any legal decisions or sign important papers.  Meals: Start with liquid foods such as gelatin or soup. Progress to regular foods as tolerated. Avoid greasy, spicy, heavy foods. If nausea and/or vomiting occur, drink only clear liquids until the nausea and/or vomiting subsides. Call your physician if vomiting continues.  Special Instructions/Symptoms: Your throat may feel dry or sore from the anesthesia or the breathing tube placed in your throat during surgery. If this causes discomfort, gargle with warm salt water. The discomfort should disappear within 24 hours.  If you had a scopolamine patch placed behind your ear for the management of post- operative nausea and/or vomiting:  1. The medication in the patch is effective for 72 hours, after which it should be removed.  Wrap patch in a tissue and discard in the trash. Wash hands thoroughly with soap and water. 2. You may remove the patch earlier than 72 hours if you experience unpleasant side effects which may include dry mouth, dizziness or visual disturbances. 3. Avoid touching the patch. Wash your hands with soap and water after contact with the patch.    About my Jackson-Pratt Bulb Drain  What is a Jackson-Pratt bulb? A Jackson-Pratt is a soft, round device used to collect drainage. It is connected to a long, thin drainage catheter, which is held in place by one or two small stiches near your surgical incision site. When the bulb is squeezed, it forms a vacuum, forcing the drainage to empty into the bulb.  Emptying the Jackson-Pratt bulb- To  empty the bulb: 1. Release the plug on the top of the bulb. 2. Pour the bulb's contents into a measuring container which your nurse will provide. 3. Record the time emptied and amount of drainage. Empty the drain(s) as often as your     doctor or nurse recommends.  Date                  Time                    Amount (Drain 1)                 Amount (Drain 2)  _____________________________________________________________________  _____________________________________________________________________  _____________________________________________________________________  _____________________________________________________________________  _____________________________________________________________________  _____________________________________________________________________  _____________________________________________________________________  _____________________________________________________________________  Squeezing the Jackson-Pratt Bulb- To squeeze the bulb: 1. Make sure the plug at the top of the bulb is open. 2. Squeeze the bulb tightly in your fist. You will hear air squeezing from the bulb. 3. Replace the plug while the bulb is squeezed. 4. Use a safety pin to attach the bulb to your clothing. This will keep the catheter from     pulling at the bulb insertion site.  When to call your doctor- Call your doctor if: Drain site becomes red, swollen or hot. You have a fever greater than 101 degrees F. There is oozing at the drain site. Drain falls out (apply a guaze bandage over the drain hole and secure it with tape). Drainage increases daily not related to activity patterns. (You will usually have more drainage when you are active than when  you are resting.) Drainage has a bad odor. About my Jackson-Pratt Bulb Drain  What is a Jackson-Pratt bulb? A Jackson-Pratt is a soft, round device used to collect drainage. It is connected to a long, thin drainage catheter,  which is held in place by one or two small stiches near your surgical incision site. When the bulb is squeezed, it forms a vacuum, forcing the drainage to empty into the bulb.  Emptying the Jackson-Pratt bulb- To empty the bulb: 1. Release the plug on the top of the bulb. 2. Pour the bulb's contents into a measuring container which your nurse will provide. 3. Record the time emptied and amount of drainage. Empty the drain(s) as often as your     doctor or nurse recommends.  Date                  Time                    Amount (Drain 1)                 Amount (Drain 2)  _____________________________________________________________________  _____________________________________________________________________  _____________________________________________________________________  _____________________________________________________________________  _____________________________________________________________________  _____________________________________________________________________  _____________________________________________________________________  _____________________________________________________________________  Squeezing the Jackson-Pratt Bulb- To squeeze the bulb: 1. Make sure the plug at the top of the bulb is open. 2. Squeeze the bulb tightly in your fist. You will hear air squeezing from the bulb. 3. Replace the plug while the bulb is squeezed. 4. Use a safety pin to attach the bulb to your clothing. This will keep the catheter from     pulling at the bulb insertion site.  When to call your doctor- Call your doctor if: Drain site becomes red, swollen or hot. You have a fever greater than 101 degrees F. There is oozing at the drain site. Drain falls out (apply a guaze bandage over the drain hole and secure it with tape). Drainage increases daily not related to activity patterns. (You will usually have more drainage when you are active than when you are  resting.) Drainage has a bad odor.

## 2022-10-03 NOTE — Interval H&P Note (Signed)
History and Physical Interval Note:  10/03/2022 6:57 AM  Krystal Schultz  has presented today for surgery, with the diagnosis of macromastia, chronic neck and back pain.  The various methods of treatment have been discussed with the patient and family. After consideration of risks, benefits and other options for treatment, the patient has consented to  Procedure(s): MAMMARY REDUCTION  (BREAST) (Bilateral) as a surgical intervention.  The patient's history has been reviewed, patient examined, no change in status, stable for surgery.  I have reviewed the patient's chart and labs.  Questions were answered to the patient's satisfaction.     Arnoldo Hooker Alekxander Isola

## 2022-10-03 NOTE — Transfer of Care (Signed)
Immediate Anesthesia Transfer of Care Note  Patient: Krystal Schultz  Procedure(s) Performed: MAMMARY REDUCTION  (BREAST) (Bilateral: Breast)  Patient Location: PACU  Anesthesia Type:General  Level of Consciousness: drowsy and patient cooperative  Airway & Oxygen Therapy: Patient Spontanous Breathing and Patient connected to face mask oxygen  Post-op Assessment: Report given to RN and Post -op Vital signs reviewed and stable  Post vital signs: Reviewed and stable  Last Vitals:  Vitals Value Taken Time  BP 123/79 10/03/22 1037  Temp    Pulse 77 10/03/22 1039  Resp 16 10/03/22 1039  SpO2 100 % 10/03/22 1039  Vitals shown include unvalidated device data.  Last Pain:  Vitals:   10/03/22 0642  TempSrc: Oral  PainSc: 0-No pain      Patients Stated Pain Goal: 4 ([id number])  Complications: No notable events documented.

## 2022-10-03 NOTE — Anesthesia Postprocedure Evaluation (Signed)
Anesthesia Post Note  Patient: Krystal Schultz  Procedure(s) Performed: MAMMARY REDUCTION  (BREAST) (Bilateral: Breast)     Patient location during evaluation: PACU Anesthesia Type: General Level of consciousness: awake Pain management: pain level controlled Vital Signs Assessment: post-procedure vital signs reviewed and stable Respiratory status: spontaneous breathing, nonlabored ventilation and respiratory function stable Cardiovascular status: blood pressure returned to baseline and stable Postop Assessment: no apparent nausea or vomiting Anesthetic complications: no   No notable events documented.  Last Vitals:  Vitals:   10/03/22 1130 10/03/22 1159  BP: 125/78 127/79  Pulse: 81 90  Resp: 15 16  Temp:  (!) 36.1 C  SpO2: 98% 96%    Last Pain:  Vitals:   10/03/22 1159  TempSrc:   PainSc: 5                  [name] [name]

## 2022-10-03 NOTE — Op Note (Signed)
Operative Note   DATE OF OPERATION: 4.2.2024  LOCATION: [facility]-outpatient  SURGICAL DIVISION: Plastic Surgery  PREOPERATIVE DIAGNOSES:  1. Macromastia 2. Chronic neck back shoulder pain  POSTOPERATIVE DIAGNOSES:  same  PROCEDURE:  Bilateral breast reduction  SURGEON: Irene Limbo MD MBA  ASSISTANT: none  ANESTHESIA:  General.   EBL: 50 ml  COMPLICATIONS: None immediate.   INDICATIONS FOR PROCEDURE:  The patient, Krystal Schultz, is a 52 y.o. female born on 1970-12-25, is here for treatment chronic neck and back pain in setting macromastia that has failed conservative measures.   FINDINGS: Right reduction 422 g Left reduction 384 g  DESCRIPTION OF PROCEDURE:  The patient was marked standing in the preoperative area to mark sternal notch, chest midline, anterior axillary lines, inframammary folds. The location of new nipple areolar complex was marked at level of on inframammary fold on anterior surface breast by palpation. This was marked symmetric over bilateral breasts. With aid of Wise pattern marker, location of new nipple areolar complex and vertical limbs 7 cm) were marked by displacement of breasts along meridian. The patient was taken to the operating room. SCDs were placed and IV antibiotics were given. The patient's operative site was prepped and draped in a sterile fashion. A time out was performed and all information was confirmed to be correct.     I began on left breast. Over left breast, superior medial pedicle marked and nipple areolar complex incised with 42 mm diameter marker. Pedicle deepithlialized and developed to chest wall. Breast tissue resected over lower pole. Medial and lateral flaps developed. Additional superior and lateral breast tissue excised. Breast tailor tacked closed.    I then directed attention to right breast where superior medial pedicle designed. NAC incised with 42 mm diameter marker. The pedicle was deepithelialized. Pedicle  developed to chest wall. Breast tissue resected over lower pole. Medial and lateral flaps developed. Additional superior and lateral breast tissue excised. Breast tailor tacked closed. Patient assessed for symmetry. Breast cavities irrigated and hemostasis obtained. Local anesthetic infiltrated throughout each breast. 15 Fr JP placed in each breast and secured with 2-0 nylon. Closure completed bilateral with 3-0 vicryl to approximate dermis along inframammary fold and vertical limb. NAC inset with 3-0 vicryl in dermis. Skin closure completed with 4-0 monocryl subcuticular throughout. Tissue adhesive applied. Dry dressing and breast binder applied.   The patient was allowed to wake from anesthesia, extubated and taken to the recovery room in satisfactory condition.   SPECIMENS: right and left breast reduction  DRAINS: 15 Fr JP in right and left breast  Irene Limbo, MD [facility] Plastic & Reconstruct[facility] Surgery  Office/ physician access line after hours [phone]

## 2022-10-04 ENCOUNTER — Encounter (HOSPITAL_BASED_OUTPATIENT_CLINIC_OR_DEPARTMENT_OTHER): Payer: Self-pay | Admitting: Plastic Surgery

## 2022-10-04 LAB — SURGICAL PATHOLOGY

## 2023-04-13 ENCOUNTER — Ambulatory Visit: Payer: Medicare Other | Admitting: Podiatry

## 2023-04-19 ENCOUNTER — Encounter: Payer: Self-pay | Admitting: Podiatry

## 2023-04-19 ENCOUNTER — Ambulatory Visit: Payer: Medicare Other

## 2023-04-19 ENCOUNTER — Ambulatory Visit: Payer: Medicare Other | Admitting: Podiatry

## 2023-04-19 DIAGNOSIS — M2042 Other hammer toe(s) (acquired), left foot: Secondary | ICD-10-CM

## 2023-04-23 NOTE — Progress Notes (Signed)
Subjective:   Patient ID: Krystal Schultz, female   DOB: 52 y.o.   MRN: [id number]   HPI Patient presents stating that she has had brain surgery and it has left her with some instability and digital deformities which seem to be flexible now but she does get some forefoot irritation and discomfort if she is on them too much.  Patient does not smoke likes to be active and is done well with the previous surgery   Review of Systems  All other systems reviewed and are negative.       Objective:  Physical Exam Vitals and nursing note reviewed.  Constitutional:      Appearance: She is well-developed.  Pulmonary:     Effort: Pulmonary effort is normal.  Musculoskeletal:        General: Normal range of motion.  Skin:    General: Skin is warm.  Neurological:     Mental Status: She is alert.     Neurovascular status intact muscle strength adequate range of motion adequate with patient found to have moderate digital deformities mild muscle strength loss extensors flexors but does have heel-toe gait that is reasonable with some discomfort plantarly in her feet with good digital perfusion well-oriented.  She is found to have excessive elongation of the second and third digits of both feet which developed distal keratotic tissue formation     Assessment:  Probability that this is related to probable damage from her brain issues with mild neurological symptomatology and digital contracture.  Hammertoe deformity second and third digits secondary to length pattern and the possibility of relationship to her history of brain surgery     Plan:  H&P reviewed at great length and I do think ultimately shortening procedure second and third digits to offload pressure distal toes would be of good benefit for order to start with orthotics and shoe gear modifications.  Reappoint to recheck  X-rays indicate that there is some elongation deformities of the lesser digits with mild contracture but more flexible  currently

## 2023-04-26 DIAGNOSIS — G44219 Episodic tension-type headache, not intractable: Secondary | ICD-10-CM | POA: Insufficient documentation

## 2023-05-14 ENCOUNTER — Ambulatory Visit: Payer: Medicare Other

## 2023-05-14 NOTE — Progress Notes (Signed)
  Patient was seen, measured / scanned for custom molded foot orthotics.  Patient will benefit from CFO's as they will help provide total contact to MLA's helping to better distribute body weight across BIL feet greater reducing plantar pressure and pain and to also encourage FF and RF alignment.  Patient was scanned items to be ordered and fit when in  [employer], CFo, CFm

## 2023-12-26 ENCOUNTER — Ambulatory Visit (INDEPENDENT_AMBULATORY_CARE_PROVIDER_SITE_OTHER)

## 2023-12-26 ENCOUNTER — Ambulatory Visit: Admitting: Podiatry

## 2023-12-26 ENCOUNTER — Encounter: Payer: Self-pay | Admitting: Podiatry

## 2023-12-26 DIAGNOSIS — M2042 Other hammer toe(s) (acquired), left foot: Secondary | ICD-10-CM

## 2023-12-26 DIAGNOSIS — S92912S Unspecified fracture of left toe(s), sequela: Secondary | ICD-10-CM | POA: Diagnosis not present

## 2023-12-27 NOTE — Progress Notes (Signed)
 Subjective:   Patient ID: Krystal Schultz, female   DOB: 53 y.o.   MRN: [id number]   HPI Patient presents stating that she traumatized her left big toe and she is concerned about possible nonhealing as it has been 6 weeks since she had the injury.  States that she was diagnosed with a fracture   ROS      Objective:  Physical Exam  Neurovascular status intact negative Homans' sign noted moderate edema of the left big toe with quite a bit of pain at the inner phalangeal joint distal to this point with somewhat reduced range of motion with patient also has hammertoe deformity with elongated 2nd and 3rd digits bilateral     Assessment:  Probability for fracture of the left big toe still in a healing mode that needs to be evaluated with digital deformities bilateral     Plan:  H&P reviewed and at this point I have recommended continued immobilization ice therapy and if it continues for another 4 to 6 weeks I want to see him back and we will have to consider injection treatment.  At this point we discussed the digits she wants to get them done but her husband needs to get his done first  X-rays indicate that there is a probable fracture of the proximal phalanx left big toe but it does not appear to be unstable appears to be healing does not appear to involve the joint and can be reevaluated if it still hurts but hopefully is just going through normal healing process

## 2023-12-31 ENCOUNTER — Ambulatory Visit: Admitting: Podiatry

## 2024-03-12 ENCOUNTER — Ambulatory Visit: Admitting: Podiatry

## 2024-03-12 ENCOUNTER — Ambulatory Visit (INDEPENDENT_AMBULATORY_CARE_PROVIDER_SITE_OTHER)

## 2024-03-12 ENCOUNTER — Encounter: Payer: Self-pay | Admitting: Podiatry

## 2024-03-12 DIAGNOSIS — M7752 Other enthesopathy of left foot: Secondary | ICD-10-CM

## 2024-03-12 DIAGNOSIS — S92912S Unspecified fracture of left toe(s), sequela: Secondary | ICD-10-CM | POA: Diagnosis not present

## 2024-03-12 DIAGNOSIS — Q282 Arteriovenous malformation of cerebral vessels: Secondary | ICD-10-CM | POA: Insufficient documentation

## 2024-03-12 MED ORDER — TRIAMCINOLONE ACETONIDE 10 MG/ML IJ SUSP
10.0000 mg | Freq: Once | INTRAMUSCULAR | Status: AC
Start: 2024-03-12 — End: 2024-03-12
  Administered 2024-03-12: 10 mg via INTRA_ARTICULAR

## 2024-03-12 NOTE — Progress Notes (Signed)
 Subjective:   Patient ID: Krystal Schultz, female   DOB: 53 y.o.   MRN: [id number]   HPI Patient states her left big toe has been bothering her and still swelling after having fractured it a number of months ago patient states that it is irritative and she is due to get her other toes fixed in a several months after her husband's are taking care of   ROS      Objective:  Physical Exam  Neurovascular status intact inflammation fluid around the hallux inner phalangeal joint left painful when pressed inability to wear shoe gear without difficulty and     Assessment:   inflammatory capsulitis of digit hallux left interphalange Il joint with possibility of arthritis of the joint secondary to previous fracture which should have healed by now with digital deformities of the lesser digits bilateral     Plan:  H&P all conditions reviewed and I went a focus on this today and reviewed x-ray.  I did go ahead today I injected the inner phalangeal joint 3 mg dexamethasone  Kenalog  5 mg Xylocaine  and explained if this does not resolve the pain that this may require fusion of the interphalangeal joint of the big toe left foot  X-rays indicate possibility for moderate arthritis around the inner phalangeal joint appears that the previous fracture has healed

## 2024-03-31 ENCOUNTER — Ambulatory Visit: Admitting: Podiatry
